# Patient Record
Sex: Female | Born: 1985 | Race: White | Hispanic: No | Marital: Single | Smoking: Never smoker
Health system: Southern US, Community
[De-identification: ages and names within clinical notes are randomized; demographics above are authoritative.]

## PROBLEM LIST (undated history)

## (undated) DIAGNOSIS — F32A Depression, unspecified: Secondary | ICD-10-CM

## (undated) DIAGNOSIS — F429 Obsessive-compulsive disorder, unspecified: Secondary | ICD-10-CM

## (undated) DIAGNOSIS — F329 Major depressive disorder, single episode, unspecified: Secondary | ICD-10-CM

---

## 2007-10-20 ENCOUNTER — Emergency Department (HOSPITAL_COMMUNITY): Admission: EM | Admit: 2007-10-20 | Discharge: 2007-10-21 | Payer: Self-pay | Admitting: Emergency Medicine

## 2011-02-20 LAB — CBC
Hemoglobin: 13
MCHC: 34.3
RBC: 4.29

## 2011-02-20 LAB — DIFFERENTIAL
Basophils Relative: 0
Lymphocytes Relative: 19
Monocytes Absolute: 0.3
Monocytes Relative: 3
Neutro Abs: 7.3

## 2011-02-20 LAB — BASIC METABOLIC PANEL
CO2: 27
Calcium: 9.4
GFR calc Af Amer: >60
Potassium: 3.8
Sodium: 137

## 2011-02-20 LAB — URINALYSIS, ROUTINE W REFLEX MICROSCOPIC
Bilirubin Urine: NEGATIVE
Hgb urine dipstick: NEGATIVE
Specific Gravity, Urine: 1.016
pH: 7.5

## 2011-02-20 LAB — POCT PREGNANCY, URINE: Operator id: 22937

## 2011-02-20 LAB — WET PREP, GENITAL
Clue Cells Wet Prep HPF POC: NONE SEEN
Yeast Wet Prep HPF POC: NONE SEEN

## 2015-03-09 ENCOUNTER — Encounter: Payer: Medicare Other | Attending: Family Medicine | Admitting: Dietician

## 2015-03-09 ENCOUNTER — Encounter: Payer: Self-pay | Admitting: Dietician

## 2015-03-09 VITALS — Ht 70.0 in | Wt 204.0 lb

## 2015-03-09 DIAGNOSIS — Z713 Dietary counseling and surveillance: Secondary | ICD-10-CM | POA: Diagnosis not present

## 2015-03-09 DIAGNOSIS — E663 Overweight: Secondary | ICD-10-CM

## 2015-03-09 NOTE — Progress Notes (Signed)
  Medical Nutrition Therapy:  Appt start time: 1440 end time:  1545.   Assessment:  Primary concerns today: Katherine Barron is here today with Katherine Barron who is her mother. Katherine Barron is slow but lives own her own. Has people to help her do some shopping. [place] in [place]. Tends to "love carbohydrates" and has gained weight each year according to her mother once she started living on her own in 2008. Has always exercised but hasn't made any changes to her diet recently. Started taking Topiramate to suppress appetite a few weeks ago but has not noticed a change in appetite. Tends to eat meals slowly.   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 She has a Financial controllerworker who does the food shopping with her and she prepares her own meals for the most part. Tends to eat impulsively according to mother. Does not miss or skip meals. Eats out about 3-4 x week.   Would like to cut back on carbohydrates and would like to lose weight and eat healthier.  Preferred Learning Style:   No preference indicated   Learning Readiness:   Ready  MEDICATIONS: see list   DIETARY INTAKE:  Usual eating pattern includes 3 meals and 3 snacks per day.  Avoided foods include: kiwi, onions, tomatoes, squash, some meat (chicken/pork)    24-hr recall:  B ( AM): Pop tart or cereal (Lucky Charms) with skim   Snk ( AM): banana  L ( PM): English muffin with peanut butter and jelly and peanut butter crackers Snk ( PM): candy D ( PM): banana (on workout days) or [employer] ham and cheese sandwich with chips or macaroni and cheese, mashed potatoes, [place] Toast, and cole slaw  Snk ( PM): cereal with milk, cheese crackers Beverages: water with flavoring, diet sprite, half sweet tea when you go out to eat, latte a couple times per week  Usual physical activity: 2 x week Zumba, clogging 1 x week for 60 mintues, gym 2 x week  treadmill for 60 minutes and then weights and has a Systems analystpersonal trainer 2 x month, walks to volunteering positions  Estimated energy needs: 1800 calories 200 g carbohydrates 135 g protein 50 g fat  Progress Towards Goal(s):  In progress.   Nutritional Diagnosis:  [place]-3.3 Overweight/obesity As related to hx of excess consumptions of starches and sweets.  As evidenced by BMI of 29.3.    Intervention:  Nutrition counseling provided. Plan: Aim to fill half of your plate with vegetables: carrots, salad greens, frozen vegetables. Have starch (pasta, bread, potatoes) on a quarter of your plate (choose one starch per meal). Try burger with no bun.  Have protein (peanut butter, ground beef, lunch meat, cheese) on a quarter of your plate.  Try yogurt, peanut butter, boiled eggs, with small amount of cereal, English muffin or fruit for breakfast.  If you are hungry between meals have a banana with peanut butter, yogurt, or grapes with peanuts.  Plan to have candy/sweets about 1 x week (skittles, cookies).   Teaching Method Utilized:  Visual Auditory Hands on  Handouts given during visit include:  MyPlate Handout  15 g CHO Snacks  Barriers to learning/adherence to lifestyle change: slow learner  Demonstrated degree of understanding via:  Teach Back   Monitoring/Evaluation:  Dietary intake, exercise, and body weight prn.

## 2015-03-09 NOTE — Patient Instructions (Addendum)
Aim to fill half of your plate with vegetables: carrots, salad greens, frozen vegetables. Have starch (pasta, bread, potatoes) on a quarter of your plate (choose one starch per meal). Try burger with no bun.  Have protein (peanut butter, ground beef, lunch meat, cheese) on a quarter of your plate.  Try yogurt, peanut butter, boiled eggs, with small amount of cereal, English muffin or fruit for breakfast.  If you are hungry between meals have a banana with peanut butter, yogurt, or grapes with peanuts.  Plan to have candy/sweets about 1 x week (skittles, cookies).

## 2015-07-04 ENCOUNTER — Encounter (HOSPITAL_COMMUNITY): Payer: Self-pay | Admitting: Emergency Medicine

## 2015-07-04 ENCOUNTER — Emergency Department (INDEPENDENT_AMBULATORY_CARE_PROVIDER_SITE_OTHER)
Admission: EM | Admit: 2015-07-04 | Discharge: 2015-07-04 | Disposition: A | Discharge status: Home / Self Care | Payer: Medicare Other | Source: Home / Self Care | Attending: Family Medicine | Admitting: Family Medicine

## 2015-07-04 DIAGNOSIS — J069 Acute upper respiratory infection, unspecified: Secondary | ICD-10-CM | POA: Diagnosis not present

## 2015-07-04 NOTE — ED Notes (Signed)
C/o cold sx onset yest associated w/emesis, runny nose, and HA A&O x4... No acute distress.

## 2015-07-04 NOTE — ED Provider Notes (Signed)
CSN: [id number]     Arrival date & time 07/04/15  1337 History   First MD Initiated Contact with Patient 07/04/15 1514     Chief Complaint  Patient presents with  . URI   (Consider location/radiation/quality/duration/timing/severity/associated sxs/prior Treatment) HPI History obtained from patient:   LOCATION:upper resp SEVERITY: DURATION:since yesterday CONTEXT: sudden onset QUALITY: MODIFYING FACTORS: cold and flu meds at home ASSOCIATED SYMPTOMS: vomiting once today. TIMING: vomiting resolved OCCUPATION:  History reviewed. No pertinent past medical history. History reviewed. No pertinent past surgical history. No family history on file. Social History  Substance Use Topics  . Smoking status: Never Smoker   . Smokeless tobacco: None  . Alcohol Use: No   OB History    No data available     Review of Systems ROS +'ve URI symptoms  Denies: HEADACHE, NAUSEA, ABDOMINAL PAIN, CHEST PAIN, CONGESTION, DYSURIA, SHORTNESS OF BREATH   Allergies  Review of patient's allergies indicates no known allergies.  Home Medications   Prior to Admission medications   Medication Sig Start Date End Date Taking? Authorizing Provider  escitalopram (LEXAPRO) 10 MG tablet Take 10 mg by mouth daily.   Yes Historical Provider, MD  topiramate (TOPAMAX) 25 MG capsule Take 25 mg by mouth 2 (two) times daily.    Historical Provider, MD   Meds Ordered and Administered this Visit  Medications - No data to display  BP 125/75 mmHg  Pulse 68  Temp(Src) 97.1 F (36.2 C) (Oral)  Resp 16  SpO2 100%  LMP 06/14/2015 No data found.   Physical Exam  Constitutional: She is oriented to person, place, and time. She appears well-developed and well-nourished.  HENT:  Head: Normocephalic and atraumatic.  Right Ear: External ear normal.  Mouth/Throat: Oropharynx is clear and moist.  Eyes: Conjunctivae are normal.  Neck: Neck supple.  Pulmonary/Chest: Effort normal and breath sounds normal.   Abdominal: Soft. Bowel sounds are normal.  Musculoskeletal: Normal range of motion.  Neurological: She is alert and oriented to person, place, and time.  Skin: Skin is warm and dry.  Psychiatric: She has a normal mood and affect. Her behavior is normal.  Nursing note and vitals reviewed.   ED Course  Procedures (including critical care time)  Labs Review Labs Reviewed - No data to display  Imaging Review No results found.   Visual Acuity Review  Right Eye Distance:   Left Eye Distance:   Bilateral Distance:    Right Eye Near:   Left Eye Near:    Bilateral Near:         MDM   1. URI (upper respiratory infection)   Patient is advised to continue home symptomatic treatment.  Patient is advised that if there are new or worsening symptoms or attend the emergency department, or contact primary care provider. Instructions of care provided discharged home in stable condition. Return to work/school note provided.  THIS NOTE WAS GENERATED USING A VOICE RECOGNITION SOFTWARE PROGRAM. ALL REASONABLE EFFORTS  WERE MADE TO PROOFREAD THIS DOCUMENT FOR ACCURACY.       Tharon Aquas, PA 07/04/15 [phone]

## 2015-07-04 NOTE — Discharge Instructions (Signed)
Upper Respiratory Infection, Adult Most upper respiratory infections (URIs) are a viral infection of the air passages leading to the lungs. A URI affects the nose, throat, and upper air passages. The most common type of URI is nasopharyngitis and is typically referred to as "the common cold." URIs run their course and usually go away on their own. Most of the time, a URI does not require medical attention, but sometimes a bacterial infection in the upper airways can follow a viral infection. This is called a secondary infection. Sinus and middle ear infections are common types of secondary upper respiratory infections. Bacterial pneumonia can also complicate a URI. A URI can worsen asthma and chronic obstructive pulmonary disease (COPD). Sometimes, these complications can require emergency medical care and may be life threatening.  CAUSES Almost all URIs are caused by viruses. A virus is a type of germ and can spread from one person to another.  RISKS FACTORS You may be at risk for a URI if:   You smoke.   You have chronic heart or lung disease.  You have a weakened defense (immune) system.   You are very young or very old.   You have nasal allergies or asthma.  You work in crowded or poorly ventilated areas.  You work in health care facilities or schools. SIGNS AND SYMPTOMS  Symptoms typically develop 2-3 days after you come in contact with a cold virus. Most viral URIs last 7-10 days. However, viral URIs from the influenza virus (flu virus) can last 14-18 days and are typically more severe. Symptoms may include:   Runny or stuffy (congested) nose.   Sneezing.   Cough.   Sore throat.   Headache.   Fatigue.   Fever.   Loss of appetite.   Pain in your forehead, behind your eyes, and over your cheekbones (sinus pain).  Muscle aches.  DIAGNOSIS  Your health care provider may diagnose a URI by:  Physical exam.  Tests to check that your symptoms are not due to  another condition such as:  Strep throat.  Sinusitis.  Pneumonia.  Asthma. TREATMENT  A URI goes away on its own with time. It cannot be cured with medicines, but medicines may be prescribed or recommended to relieve symptoms. Medicines may help:  Reduce your fever.  Reduce your cough.  Relieve nasal congestion. HOME CARE INSTRUCTIONS   Take medicines only as directed by your health care provider.   Gargle warm saltwater or take cough drops to comfort your throat as directed by your health care provider.  Use a warm mist humidifier or inhale steam from a shower to increase air moisture. This may make it easier to breathe.  Drink enough fluid to keep your urine clear or pale yellow.   Eat soups and other clear broths and maintain good nutrition.   Rest as needed.   Return to work when your temperature has returned to normal or as your health care provider advises. You may need to stay home longer to avoid infecting others. You can also use a face mask and careful hand washing to prevent spread of the virus.  Increase the usage of your inhaler if you have asthma.   Do not use any tobacco products, including cigarettes, chewing tobacco, or electronic cigarettes. If you need help quitting, ask your health care provider. PREVENTION  The best way to protect yourself from getting a cold is to practice good hygiene.   Avoid oral or hand contact with people with cold   symptoms.   Wash your hands often if contact occurs.  There is no clear evidence that vitamin C, vitamin E, echinacea, or exercise reduces the chance of developing a cold. However, it is always recommended to get plenty of rest, exercise, and practice good nutrition.  SEEK MEDICAL CARE IF:   You are getting worse rather than better.   Your symptoms are not controlled by medicine.   You have chills.  You have worsening shortness of breath.  You have brown or red mucus.  You have yellow or brown nasal  discharge.  You have pain in your face, especially when you bend forward.  You have a fever.  You have swollen neck glands.  You have pain while swallowing.  You have white areas in the back of your throat. SEEK IMMEDIATE MEDICAL CARE IF:   You have severe or persistent:  Headache.  Ear pain.  Sinus pain.  Chest pain.  You have chronic lung disease and any of the following:  Wheezing.  Prolonged cough.  Coughing up blood.  A change in your usual mucus.  You have a stiff neck.  You have changes in your:  Vision.  Hearing.  Thinking.  Mood. MAKE SURE YOU:   Understand these instructions.  Will watch your condition.  Will get help right away if you are not doing well or get worse.   This information is not intended to replace advice given to you by your health care provider. Make sure you discuss any questions you have with your health care provider.   Document Released: 11/06/2000 Document Revised: 09/27/2014 Document Reviewed: 08/18/2013 Elsevier Interactive Patient Education 2016 Elsevier Inc.  

## 2015-09-21 ENCOUNTER — Encounter (HOSPITAL_COMMUNITY): Payer: Self-pay | Admitting: *Deleted

## 2015-09-21 ENCOUNTER — Ambulatory Visit (HOSPITAL_COMMUNITY)
Admission: EM | Admit: 2015-09-21 | Discharge: 2015-09-21 | Disposition: A | Discharge status: Home / Self Care | Payer: Medicare Other | Attending: Emergency Medicine | Admitting: Emergency Medicine

## 2015-09-21 DIAGNOSIS — J302 Other seasonal allergic rhinitis: Secondary | ICD-10-CM

## 2015-09-21 HISTORY — DX: Depression, unspecified: F32.A

## 2015-09-21 HISTORY — DX: Major depressive disorder, single episode, unspecified: F32.9

## 2015-09-21 MED ORDER — IPRATROPIUM BROMIDE 0.06 % NA SOLN: 2.0000 | Freq: Four times a day (QID) | NASAL | Status: DC

## 2015-09-21 NOTE — ED Notes (Signed)
Patient reports sore throat, runny nose, and cough since Wednesday. Denies fever.

## 2015-09-21 NOTE — Discharge Instructions (Signed)
Allergic Rhinitis For nasal and head congestion may take Sudafed PE 10 mg every 4 hours as needed. Saline nasal spray used frequently. For drainage may use Allegra, Claritin or Zyrtec. If you need stronger medicine to stop drainage may take Chlor-Trimeton 2-4 mg every 4 hours. This may cause drowsiness. Ibuprofen 600 mg every 6 hours as needed for pain, discomfort or fever. Drink plenty of fluids and stay well-hydrated. Flonase or rhinocort nasal spray as directed for nasal allergies.  Allergic rhinitis is when the mucous membranes in the nose respond to allergens. Allergens are particles in the air that cause your body to have an allergic reaction. This causes you to release allergic antibodies. Through a chain of events, these eventually cause you to release histamine into the blood stream. Although meant to protect the body, it is this release of histamine that causes your discomfort, such as frequent sneezing, congestion, and an itchy, runny nose.  CAUSES Seasonal allergic rhinitis (hay fever) is caused by pollen allergens that may come from grasses, trees, and weeds. Year-round allergic rhinitis (perennial allergic rhinitis) is caused by allergens such as house dust mites, pet dander, and mold spores. SYMPTOMS  Nasal stuffiness (congestion).  Itchy, runny nose with sneezing and tearing of the eyes. DIAGNOSIS Your health care provider can help you determine the allergen or allergens that trigger your symptoms. If you and your health care provider are unable to determine the allergen, skin or blood testing may be used. Your health care provider will diagnose your condition after taking your health history and performing a physical exam. Your health care provider may assess you for other related conditions, such as asthma, pink eye, or an ear infection. TREATMENT Allergic rhinitis does not have a cure, but it can be controlled by:  Medicines that block allergy symptoms. These may include  allergy shots, nasal sprays, and oral antihistamines.  Avoiding the allergen. Hay fever may often be treated with antihistamines in pill or nasal spray forms. Antihistamines block the effects of histamine. There are over-the-counter medicines that may help with nasal congestion and swelling around the eyes. Check with your health care provider before taking or giving this medicine. If avoiding the allergen or the medicine prescribed do not work, there are many new medicines your health care provider can prescribe. Stronger medicine may be used if initial measures are ineffective. Desensitizing injections can be used if medicine and avoidance does not work. Desensitization is when a patient is given ongoing shots until the body becomes less sensitive to the allergen. Make sure you follow up with your health care provider if problems continue. HOME CARE INSTRUCTIONS It is not possible to completely avoid allergens, but you can reduce your symptoms by taking steps to limit your exposure to them. It helps to know exactly what you are allergic to so that you can avoid your specific triggers. SEEK MEDICAL CARE IF:  You have a fever.  You develop a cough that does not stop easily (persistent).  You have shortness of breath.  You start wheezing.  Symptoms interfere with normal daily activities.   This information is not intended to replace advice given to you by your health care provider. Make sure you discuss any questions you have with your health care provider.   Document Released: 02/05/2001 Document Revised: 06/03/2014 Document Reviewed: 01/18/2013 Elsevier Interactive Patient Education [employer] Elsevier Inc.

## 2015-09-21 NOTE — ED Provider Notes (Signed)
CSN: [id number]     Arrival date & time 09/21/15  1256 History   First MD Initiated Contact with Patient 09/21/15 1333     Chief Complaint  Patient presents with  . Sore Throat  . Nasal Congestion   (Consider location/radiation/quality/duration/timing/severity/associated sxs/prior Treatment) HPI Comments: 30 year old female complaining ofsore throat started last night. It is associated with nasal congestion, runny nose, cough and feeling tired. Denies fever chest pain, shortness of breath, abdominal pain or other symptoms.  Patient is a 30 y.o. female presenting with pharyngitis.  Sore Throat Pertinent negatives include no shortness of breath.    Past Medical History  Diagnosis Date  . Depression    History reviewed. No pertinent past surgical history. History reviewed. No pertinent family history. Social History  Substance Use Topics  . Smoking status: Never Smoker   . Smokeless tobacco: None  . Alcohol Use: No   OB History    No data available     Review of Systems  Constitutional: Positive for fatigue. Negative for fever, chills, activity change and appetite change.  HENT: Positive for congestion, rhinorrhea, sore throat and voice change. Negative for facial swelling and postnasal drip.   Eyes: Negative.   Respiratory: Positive for cough. Negative for chest tightness and shortness of breath.   Cardiovascular: Negative.   Gastrointestinal: Negative.   Musculoskeletal: Negative for neck pain and neck stiffness.  Skin: Negative for pallor and rash.  Neurological: Negative.   All other systems reviewed and are negative.   Allergies  Review of patient's allergies indicates no known allergies.  Home Medications   Prior to Admission medications   Medication Sig Start Date End Date Taking? Authorizing Provider  escitalopram (LEXAPRO) 10 MG tablet Take 10 mg by mouth daily.    Historical Provider, MD  ipratropium (ATROVENT) 0.06 % nasal spray Place 2 sprays into both  nostrils 4 (four) times daily. For runny nose 09/21/15   Hayden Rasmussenavid Kenichi Cassada, NP  topiramate (TOPAMAX) 25 MG capsule Take 25 mg by mouth 2 (two) times daily.    Historical Provider, MD   Meds Ordered and Administered this Visit  Medications - No data to display  BP 112/78 mmHg  Pulse 78  Temp(Src) 97.6 F (36.4 C) (Oral)  Resp 16  SpO2 100%  LMP 09/11/2015 No data found.   Physical Exam  Constitutional: She is oriented to person, place, and time. She appears well-developed and well-nourished. No distress.  HENT:  Mouth/Throat: No oropharyngeal exudate.  Bilateral TMs are normal. Oropharynx is clear. No erythema or exudates.There is a moderate amount of clear to thick, whitish PND.  Eyes: Conjunctivae and EOM are normal.  Neck: Normal range of motion. Neck supple.  Cardiovascular: Normal rate, regular rhythm and normal heart sounds.   Pulmonary/Chest: Effort normal and breath sounds normal. No respiratory distress. She has no wheezes. She has no rales.  Musculoskeletal: Normal range of motion. She exhibits no edema.  Lymphadenopathy:    She has no cervical adenopathy.  Neurological: She is alert and oriented to person, place, and time.  Skin: Skin is warm and dry. No rash noted.  Psychiatric: She has a normal mood and affect.  Nursing note and vitals reviewed.   ED Course  Procedures (including critical care time)  Labs Review Labs Reviewed - No data to display  Imaging Review No results found.   Visual Acuity Review  Right Eye Distance:   Left Eye Distance:   Bilateral Distance:    Right Eye Near:  Left Eye Near:    Bilateral Near:         MDM   1. Other seasonal allergic rhinitis    Allergic Rhinitis For nasal and head congestion may take Sudafed PE 10 mg every 4 hours as needed. Saline nasal spray used frequently. For drainage may use Allegra, Claritin or Zyrtec. If you need stronger medicine to stop drainage may take Chlor-Trimeton 2-4 mg every 4 hours.  This may cause drowsiness. Ibuprofen 600 mg every 6 hours as needed for pain, discomfort or fever. Drink plenty of fluids and stay well-hydrated. Flonase or rhinocort nasal spray as directed for nasal allergies. Meds ordered this encounter  Medications  . ipratropium (ATROVENT) 0.06 % nasal spray    Sig: Place 2 sprays into both nostrils 4 (four) times daily. For runny nose    Dispense:  15 mL    Refill:  12    Order Specific Question:  Supervising Provider    Answer:  [facility] [phone]   Verbal and written discharge instructions have been reviewed and given to the patient  by the provider to include medications and other care measures.     Hayden Rasmussen, NP 09/21/15 1347

## 2016-07-16 ENCOUNTER — Encounter (HOSPITAL_COMMUNITY): Payer: Self-pay | Admitting: Emergency Medicine

## 2016-07-16 ENCOUNTER — Ambulatory Visit (HOSPITAL_COMMUNITY)
Admission: EM | Admit: 2016-07-16 | Discharge: 2016-07-16 | Disposition: A | Discharge status: Home / Self Care | Payer: Medicare Other | Attending: Family Medicine | Admitting: Family Medicine

## 2016-07-16 DIAGNOSIS — L235 Allergic contact dermatitis due to other chemical products: Secondary | ICD-10-CM

## 2016-07-16 MED ORDER — PREDNISONE 20 MG PO TABS: 20.0000 mg | ORAL_TABLET | Freq: Two times a day (BID) | ORAL | 0 refills | Status: AC

## 2016-07-16 MED ORDER — TRIAMCINOLONE ACETONIDE 0.1 % EX CREA: 1.0000 "application " | TOPICAL_CREAM | Freq: Two times a day (BID) | CUTANEOUS | 1 refills | Status: AC

## 2016-07-16 NOTE — ED Triage Notes (Signed)
[facility] with a complaint of a rash on her right leg that started 2 days ago and has now started to spread.

## 2016-07-16 NOTE — Discharge Instructions (Signed)
You have contact dermatitis. I have prescribed prednisone. Take one tablet twice a day for 6 days. I also recommend either taking over-the-counter Benadryl 50 mg every 6 hours not to exceed 300 mg and any 24-hour period for one to 2 days, or nonsedating antihistamine such as Claritin, Allegra, Zyrtec, or Xyzal daily. Should her symptoms persist, or fail to resolve, follow up with her primary care provider or return to clinic as needed. I have also prescribed a steroid cream for topical use if needed after you have finished your oral steroids.

## 2016-07-16 NOTE — ED Provider Notes (Signed)
CSN: [id number]     Arrival date & time 07/16/16  1048 History   First MD Initiated Contact with Patient 07/16/16 1232     Chief Complaint  Patient presents with  . Rash   (Consider location/radiation/quality/duration/timing/severity/associated sxs/prior Treatment) 31 year old female patient presents to clinic with 2-3 day history of rash bilaterally on her legs, back, and abdomen. Patient has chronic history of multiple allergies. Uses products skin, she did try a new type of dryer sheet, and started having this reaction afterwards. She describes the rash as itchy and uncomfortable. She has been using over-the-counter Benadryl and Zyrtec with some relief   The history is provided by the patient.  Rash    Past Medical History:  Diagnosis Date  . Depression    History reviewed. No pertinent surgical history. History reviewed. No pertinent family history. Social History  Substance Use Topics  . Smoking status: Never Smoker  . Smokeless tobacco: Not on file  . Alcohol use No   OB History    No data available     Review of Systems  Reason unable to perform ROS: as covered in HPI.  Skin: Positive for rash.  All other systems reviewed and are negative.   Allergies  Patient has no known allergies.  Home Medications   Prior to Admission medications   Medication Sig Start Date End Date Taking? Authorizing Provider  escitalopram (LEXAPRO) 10 MG tablet Take 10 mg by mouth daily.   Yes Historical Provider, MD  predniSONE (DELTASONE) 20 MG tablet Take 1 tablet (20 mg total) by mouth 2 (two) times daily with a meal. 07/16/16 07/22/16  Dorena Bodo, NP  triamcinolone cream (KENALOG) 0.1 % Apply 1 application topically 2 (two) times daily. 07/16/16   Dorena Bodo, NP   Meds Ordered and Administered this Visit  Medications - No data to display  BP 112/64 (BP Location: Right Arm)   Pulse 85   Temp 97.6 F (36.4 C) (Oral)   Resp 18   SpO2 100%  No data found.   Physical  Exam  Constitutional: She is oriented to person, place, and time. She appears well-developed and well-nourished.  Cardiovascular: Normal rate and regular rhythm.   Pulmonary/Chest: Effort normal and breath sounds normal.  Neurological: She is alert and oriented to person, place, and time.  Skin: Skin is warm and dry. Capillary refill takes less than 2 seconds. She is not diaphoretic.  urticarial, erythemic, maculopapular rash present bilaterally lower legs to inner thighs, lower abdominal area, and lower back.  Psychiatric: She has a normal mood and affect.  Nursing note and vitals reviewed.   Urgent Care Course     Procedures (including critical care time)  Labs Review Labs Reviewed - No data to display  Imaging Review No results found.   Visual Acuity Review  Right Eye Distance:   Left Eye Distance:   Bilateral Distance:    Right Eye Near:   Left Eye Near:    Bilateral Near:         MDM   1. Allergic dermatitis due to other chemical product    You have contact dermatitis. I have prescribed prednisone. Take one tablet twice a day for 6 days. I also recommend either taking over-the-counter Benadryl 50 mg every 6 hours not to exceed 300 mg and any 24-hour period for one to 2 days, or nonsedating antihistamine such as Claritin, Allegra, Zyrtec, or Xyzal daily. Should her symptoms persist, or fail to resolve, follow up with her primary  care provider or return to clinic as needed. I have also prescribed a steroid cream for topical use if needed after you have finished your oral steroids.       Dorena BodoLawrence Johari Bennetts, NP 07/16/16 1346

## 2017-03-20 ENCOUNTER — Other Ambulatory Visit (HOSPITAL_COMMUNITY)
Admission: RE | Admit: 2017-03-20 | Discharge: 2017-03-20 | Disposition: A | Discharge status: Home / Self Care | Payer: 59 | Source: Ambulatory Visit | Attending: Family Medicine | Admitting: Family Medicine

## 2017-03-20 ENCOUNTER — Other Ambulatory Visit: Payer: Self-pay | Admitting: Family Medicine

## 2017-03-20 DIAGNOSIS — Z124 Encounter for screening for malignant neoplasm of cervix: Secondary | ICD-10-CM | POA: Diagnosis present

## 2017-03-20 DIAGNOSIS — R87810 Cervical high risk human papillomavirus (HPV) DNA test positive: Secondary | ICD-10-CM | POA: Diagnosis present

## 2017-03-24 LAB — CYTOLOGY - PAP
DIAGNOSIS: NEGATIVE
HPV (WINDOPATH): DETECTED — AB
HPV 16/18/45 GENOTYPING: NEGATIVE

## 2018-03-30 ENCOUNTER — Other Ambulatory Visit: Payer: Self-pay | Admitting: Family Medicine

## 2018-03-30 ENCOUNTER — Other Ambulatory Visit (HOSPITAL_COMMUNITY)
Admission: RE | Admit: 2018-03-30 | Discharge: 2018-03-30 | Disposition: A | Discharge status: Home / Self Care | Payer: 59 | Source: Ambulatory Visit | Attending: Family Medicine | Admitting: Family Medicine

## 2018-03-30 DIAGNOSIS — Z0000 Encounter for general adult medical examination without abnormal findings: Secondary | ICD-10-CM | POA: Diagnosis present

## 2018-04-01 LAB — CYTOLOGY - PAP
CHLAMYDIA, DNA PROBE: NEGATIVE
DIAGNOSIS: NEGATIVE
HPV (WINDOPATH): NOT DETECTED
NEISSERIA GONORRHEA: NEGATIVE
TRICH (WINDOWPATH): NEGATIVE

## 2018-04-09 ENCOUNTER — Ambulatory Visit: Payer: Medicare Other | Admitting: Registered"

## 2018-04-29 ENCOUNTER — Encounter: Payer: 59 | Attending: Family Medicine | Admitting: Registered"

## 2018-04-29 ENCOUNTER — Encounter: Payer: Self-pay | Admitting: Registered"

## 2018-04-29 DIAGNOSIS — E669 Obesity, unspecified: Secondary | ICD-10-CM | POA: Diagnosis present

## 2018-04-29 NOTE — Progress Notes (Signed)
  Medical Nutrition Therapy:  Appt start time: 3:20 end time:  4:18.   Assessment:  Primary concerns today: Pt's mom called the day prior to appt to update on pt's needs. Pt's mom states pt loves carbohydrates and weight has been steadily increasing over the years. Mom states pt needs ideas for healthy, quick meals. Mom states pt likes humor and challenges.   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 Pt states she watches tv in spare time. Pt states she likes to go clogging. Pt states she sleeps about 11 hrs/night.   Pt states she likes cheeseburger. Pt states she prepares her own food in the microwave at home; also has a gas stove and staff assist her with using it. Pt states she likes to cook macaroni and cheese, spaghetti, hamburger helper, and tacos. Pt states she uses crock pot at times, cooking meatballs or soup. Pt states she likes certain vegetables: corn, carrots, peas, sweet potatoes, celery, broccoli and cheese (steamables).   Pt's staff members joins us at the end of appt to help with dietary recall. Staff states pt will sometimes buy salads/sandwiches and sometimes not eat it. Staff state they grocery shop every Sat and pt goals include pt cooking 3 meals/week.  Preferred Learning Style:   No preference indicated   Learning Readiness:   Contemplating  Ready  Change in progress   MEDICATIONS: See list   DIETARY INTAKE:  Usual eating pattern includes 3 meals and 0 snacks per day.  Everyday foods include cereal, pancakes, fast food, chex mix, macaroni and cheese, and ice cream.  Avoided foods include vegetables.    24-hr recall:  B ( AM): cereal   Snk ( AM): none  L ( PM): McDonald's-2 cheeseburgers + large fries + large sweet tea + ice cream cone Snk ( PM): chex mix D ( PM): cereal or pancakes or tacos or macaroni and cheese Snk ( PM): none Beverages: sweet tea, water, cranberry juice, skim milk, sparkling  water, chocolate milk, sprite/coke/mountain dew (sometimes diet)  Usual physical activity: cardio + strength training 60 min, 5 days/week  Estimated energy needs: 2200 calories 248 g carbohydrates 165 g protein 61 g fat  Progress Towards Goal(s):  In progress.   Nutritional Diagnosis:  NI-5.8.5 Inadeqate fiber intake As related to less than optimal food-preparation practices reliance on overprocessed foods.  As evidenced by dietary recall.    Intervention:  Nutrition education and counseling. Pt was educated and counseled on the benefits of increasing vegetable intake, purpose of carbohydrates, having balanced meals, and convenient ways to increase vegetable intake, and meal planning ideas. Pt and staff member were in agreement with goals listed.  Goals: - Try to have vegetables with lunch and dinner.  - Get steamable vegetables in bag in frozen section.  - Breakfast have carbohydrate plus protein. See handout.   Teaching Method Utilized:  Visual Auditory Hands on  Handouts given during visit include:  Meal Ideas  Barriers to learning/adherence to lifestyle change: some cognitive deficits but motivated to implement changes  Demonstrated degree of understanding via:  Teach Back   Monitoring/Evaluation:  Dietary intake, exercise, and body weight in 1 month(s).

## 2018-04-29 NOTE — Patient Instructions (Signed)
-   Try to have vegetables with lunch and dinner.   - Get steamable vegetables in bag in frozen section.   - Breakfast have carbohydrate plus protein. See handout.

## 2018-06-03 ENCOUNTER — Encounter: Payer: Self-pay | Admitting: Registered"

## 2018-06-03 ENCOUNTER — Encounter: Payer: 59 | Attending: Family Medicine | Admitting: Registered"

## 2018-06-03 DIAGNOSIS — E669 Obesity, unspecified: Secondary | ICD-10-CM | POA: Insufficient documentation

## 2018-06-03 NOTE — Patient Instructions (Addendum)
-   Try to have dinner daily, no later than 5pm.   - Aim to have protein + vegetables + carbohydrates with dinner.   - Try [employer] burgers for microwaveable protein options. See handout.

## 2018-06-03 NOTE — Progress Notes (Signed)
  Medical Nutrition Therapy:  Appt start time: 3:20 end time:  3:45.   Assessment:  Primary concerns today: Pt's mom called the day prior to appt to update on pt's needs. Pt's mom states pt loves carbohydrates and weight has been steadily increasing over the years. Mom states pt needs ideas for healthy, quick meals. Mom states pt likes humor and challenges.   Pt expectations: none stated  Pt arrives stating she did not eat dinner last night because she did not feel hungry. Pt states she goes to sleep around 6:30pm nightly.   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 Pt states she watches tv in spare time. Pt states she likes to go clogging. Pt states she sleeps about 11 hrs/night.   Pt states she likes cheeseburger. Pt states she prepares her own food in the microwave at home; also has a gas stove and staff assist her with using it. Pt states she likes to cook macaroni and cheese, spaghetti, hamburger helper, and tacos. Pt states she uses crock pot at times, cooking meatballs or soup. Pt states she likes certain vegetables: corn, carrots, peas, sweet potatoes, celery, broccoli and cheese (steamables).   Pt's staff member joins [place] at the end of appt to help with dietary recall. Staff states pt will sometimes buy salads/sandwiches and sometimes not eat it. Staff state they grocery shop every Sat and pt goals include pt cooking 3 meals/week.  Preferred Learning Style:   No preference indicated   Learning Readiness:   Contemplating  Ready  Change in progress   MEDICATIONS: See list   DIETARY INTAKE:  Usual eating pattern includes 3 meals and 0 snacks per day.  Everyday foods include cereal, pancakes, fast food, chex mix, macaroni and cheese, and ice cream.  Avoided foods include vegetables.    24-hr recall:  B ( AM): cereal   Snk ( AM): none  L (12 PM): McDonald's-2 cheeseburgers + large fries + large sweet tea + ice cream cone Snk ( PM): chex mix D (  PM):sometimes skips Snk ( PM): none Beverages: sweet tea, water, cranberry juice, skim milk, sparkling water, chocolate milk, sprite/coke/mountain dew (sometimes diet), 2% milk   Usual physical activity: cardio + strength training 60 min, 5 days/week  Estimated energy needs: 2200 calories 248 g carbohydrates 165 g protein 61 g fat  Progress Towards Goal(s):  In progress.   Nutritional Diagnosis:  NI-5.8.5 Inadeqate fiber intake As related to less than optimal food-preparation practices reliance on overprocessed foods.  As evidenced by dietary recall.    Intervention:  Nutrition education and counseling. Pt was educated and counseled on the benefits of not skipping meals and convenient ways to add variety for meals. Pt was in agreement with goals listed: Goals: - Try to have dinner daily, no later than 5pm.  - Aim to have protein + vegetables + carbohydrates with dinner.  - Try [employer] burgers for microwaveable protein options. See handout.   Teaching Method Utilized:  Visual Auditory Hands on  Handouts given during visit include:  [employer] options  Barriers to learning/adherence to lifestyle change: some cognitive deficits but motivated to implement changes  Demonstrated degree of understanding via:  Teach Back   Monitoring/Evaluation:  Dietary intake, exercise, and body weight in 1 month(s).

## 2018-07-08 ENCOUNTER — Ambulatory Visit: Payer: Medicare Other | Admitting: Registered"

## 2018-09-26 ENCOUNTER — Encounter (HOSPITAL_COMMUNITY): Payer: Self-pay | Admitting: Emergency Medicine

## 2018-09-26 ENCOUNTER — Emergency Department (HOSPITAL_COMMUNITY)
Admission: EM | Admit: 2018-09-26 | Discharge: 2018-09-26 | Disposition: A | Discharge status: Home / Self Care | Payer: 59 | Attending: Emergency Medicine | Admitting: Emergency Medicine

## 2018-09-26 ENCOUNTER — Other Ambulatory Visit: Payer: Self-pay

## 2018-09-26 ENCOUNTER — Emergency Department (HOSPITAL_COMMUNITY): Payer: 59

## 2018-09-26 DIAGNOSIS — Y929 Unspecified place or not applicable: Secondary | ICD-10-CM | POA: Diagnosis not present

## 2018-09-26 DIAGNOSIS — Y999 Unspecified external cause status: Secondary | ICD-10-CM | POA: Diagnosis not present

## 2018-09-26 DIAGNOSIS — S93401A Sprain of unspecified ligament of right ankle, initial encounter: Secondary | ICD-10-CM

## 2018-09-26 DIAGNOSIS — Z79899 Other long term (current) drug therapy: Secondary | ICD-10-CM | POA: Diagnosis not present

## 2018-09-26 DIAGNOSIS — Y9389 Activity, other specified: Secondary | ICD-10-CM | POA: Diagnosis not present

## 2018-09-26 DIAGNOSIS — S9031XA Contusion of right foot, initial encounter: Secondary | ICD-10-CM

## 2018-09-26 DIAGNOSIS — S99921A Unspecified injury of right foot, initial encounter: Secondary | ICD-10-CM | POA: Diagnosis present

## 2018-09-26 HISTORY — DX: Obsessive-compulsive disorder, unspecified: F42.9

## 2018-09-26 NOTE — Progress Notes (Signed)
Orthopedic Tech Progress Note Patient Details:  Katherine Barron 01-21-1986 [id number]  Ortho Devices Type of Ortho Device: CAM walker Ortho Device/Splint Interventions: Application   Post Interventions Patient Tolerated: Well Instructions Provided: Care of device   Saul Fordyce 09/26/2018, 6:16 PM
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
## 2018-09-26 NOTE — Progress Notes (Signed)
Orthopedic Tech Progress Note Patient Details:  Katherine Barron 03-03-1986 [id number]  Ortho Devices Type of Ortho Device: Crutches Ortho Device/Splint Interventions: Application   Post Interventions Patient Tolerated: Well Instructions Provided: Care of device, Poper ambulation with device   Saul Fordyce 09/26/2018, 5:24 PM

## 2018-09-26 NOTE — ED Notes (Signed)
Ortho tech paged for cam walker and crutches 
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
